# Patient Record
Sex: Male | Born: 1995 | Race: White | Hispanic: No | Marital: Single | Smoking: Never smoker
Health system: Southern US, Community
[De-identification: ages and names within clinical notes are randomized; demographics above are authoritative.]

## PROBLEM LIST (undated history)

## (undated) HISTORY — PX: TESTICULAR EXPLORATION: SHX5145

---
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
## 2017-05-26 ENCOUNTER — Emergency Department (INDEPENDENT_AMBULATORY_CARE_PROVIDER_SITE_OTHER)
Admission: EM | Admit: 2017-05-26 | Discharge: 2017-05-26 | Disposition: A | Discharge status: Home / Self Care | Payer: Managed Care, Other (non HMO) | Source: Home / Self Care

## 2017-05-26 ENCOUNTER — Other Ambulatory Visit: Payer: Self-pay

## 2017-05-26 ENCOUNTER — Encounter: Payer: Self-pay | Admitting: Family Medicine

## 2017-05-26 DIAGNOSIS — S61432A Puncture wound without foreign body of left hand, initial encounter: Secondary | ICD-10-CM | POA: Diagnosis not present

## 2017-05-26 MED ORDER — MUPIROCIN 2 % EX OINT: 1.0000 "application " | TOPICAL_OINTMENT | Freq: Three times a day (TID) | CUTANEOUS | 1 refills | Status: DC

## 2017-05-26 MED ORDER — MUPIROCIN 2 % EX OINT: 1.0000 "application " | TOPICAL_OINTMENT | Freq: Three times a day (TID) | CUTANEOUS | 1 refills | Status: AC

## 2017-05-26 MED ORDER — CEPHALEXIN 500 MG PO CAPS: 500.0000 mg | ORAL_CAPSULE | Freq: Three times a day (TID) | ORAL | 0 refills | Status: AC

## 2017-05-26 NOTE — ED Triage Notes (Signed)
Pt has a penetrating wound to the left hand.  Pt had a nail penetrate his hand and pulled it out.

## 2017-05-26 NOTE — ED Provider Notes (Signed)
[facility]   [id number] 05/26/17 Arrival Time: 1633   SUBJECTIVE:  Juan Cunningham is a 22 y.o. male who presents to the urgent care with complaint of left hand injury.  A penetrating nail from a nail gun punctured the ulnar side of the left hand about an hour prior to arrival.  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  Patient denies any numbness or weakness in the hand.  He feels that he has full range of motion and has minimal tenderness at this point.  Patient's main concern was that the nail penetrated about an inch and a quarter into the hyperthenar region of his hand.     History reviewed. No pertinent past medical history. No family history on file. Social History   Socioeconomic History  . Marital status: Single    Spouse name: Not on file  . Number of children: Not on file  . Years of education: Not on file  . Highest education level: Not on file  Occupational History  . Not on file  Social Needs  . Financial resource strain: Not on file  . Food insecurity:    Worry: Not on file    Inability: Not on file  . Transportation needs:    Medical: Not on file    Non-medical: Not on file  Tobacco Use  . Smoking status: Not on file  Substance and Sexual Activity  . Alcohol use: Not on file  . Drug use: Not on file  . Sexual activity: Not on file  Lifestyle  . Physical activity:    Days per week: Not on file    Minutes per session: Not on file  . Stress: Not on file  Relationships  . Social connections:    Talks on phone: Not on file    Gets together: Not on file    Attends religious service: Not on file    Active member of club or organization: Not on file    Attends meetings of clubs or organizations: Not on file    Relationship status: Not on file  . Intimate partner violence:    Fear of current or ex partner: Not on file    Emotionally abused: Not on file    Physically abused: Not on file    Forced  sexual activity: Not on file  Other Topics Concern  . Not on file  Social History Narrative  . Not on file   No outpatient medications have been marked as taking for the 05/26/17 encounter [facility]).   No Known Allergies    ROS: As per HPI, remainder of ROS negative.   OBJECTIVE:   Vitals:   05/26/17 1719  BP: (!) 142/84  Pulse: 70  Temp: 99.2 F (37.3 C)  TempSrc: Oral  SpO2: 97%  Weight: 167 lb (75.8 kg)  Height:  (1.956 m)     General appearance: alert; no distress Eyes: PERRL; EOMI; conjunctiva normal HENT: normocephalic; atraumatic;  oral mucosa normal Neck: supple Back: no CVA tenderness Extremities: no cyanosis or edema; symmetrical with no gross deformities; puncture site identified on the left hand on the ulnar side of the fifth metacarpal.  Patient demonstrates full range of motion of his hand.  There is no swelling of the hyperthenar region and sensation is intact to touch.  There is no ecchymosis. Skin: warm and dry Neurologic: normal gait; grossly normal Psychological: alert and cooperative; normal mood and affect     ASSESSMENT &  PLAN:  1. Puncture wound of left hand without foreign body, initial encounter    Continue to wash the left hand thoroughly at least three times a day for the next 3-4 days.  If any redness, swelling or streaking occurs, start the Cephalexin antibiotic by mouth.  In the meantime, apply Bactroban ointment to the entrance site several times a day and keep the wound covered with bandage when working Meds ordered this encounter  Medications  . cephALEXin (KEFLEX) 500 MG capsule    Sig: Take 1 capsule (500 mg total) by mouth 3 (three) times daily.    Dispense:  20 capsule    Refill:  0  . mupirocin ointment (BACTROBAN) 2 %    Sig: Apply 1 application topically 3 (three) times daily.    Dispense:  22 g    Refill:  1    Reviewed expectations re: course of current medical issues. Questions answered. Outlined  signs and symptoms indicating need for more acute intervention. Patient verbalized understanding. After Visit Summary given.    Procedures:      Elvina Sidle, MD 05/26/17 1724

## 2017-05-26 NOTE — Discharge Instructions (Addendum)
Continue to wash the left hand thoroughly at least three times a day for the next 3-4 days.  If any redness, swelling or streaking occurs, start the Cephalexin antibiotic by mouth.  In the meantime, apply Bactroban ointment to the entrance site several times a day and keep the wound covered with bandage when working
